# Patient Record
Sex: Female | Born: 1975 | Race: White | Hispanic: No | Marital: Married | Smoking: Never smoker
Health system: Southern US, Community
[De-identification: ages and names within clinical notes are randomized; demographics above are authoritative.]

## PROBLEM LIST (undated history)

## (undated) DIAGNOSIS — R51 Headache: Secondary | ICD-10-CM

## (undated) HISTORY — PX: NO PAST SURGERIES: SHX2092

---

## 2011-08-21 ENCOUNTER — Encounter (HOSPITAL_COMMUNITY): Payer: Self-pay

## 2011-08-24 ENCOUNTER — Other Ambulatory Visit: Payer: Self-pay | Admitting: Obstetrics

## 2011-08-30 ENCOUNTER — Encounter (HOSPITAL_COMMUNITY): Payer: Self-pay

## 2011-08-30 ENCOUNTER — Encounter (HOSPITAL_COMMUNITY)
Admission: RE | Admit: 2011-08-30 | Discharge: 2011-08-30 | Disposition: A | Discharge status: Home / Self Care | Payer: BC Managed Care – PPO | Source: Ambulatory Visit | Attending: Obstetrics | Admitting: Obstetrics

## 2011-08-30 HISTORY — DX: Headache: R51

## 2011-08-30 LAB — CBC
HCT: 39.2 % (ref 36.0–46.0)
Hemoglobin: 13.3 g/dL (ref 12.0–15.0)
MCV: 91 fL (ref 78.0–100.0)
RBC: 4.31 MIL/uL (ref 3.87–5.11)
RDW: 12.8 % (ref 11.5–15.5)
WBC: 4.8 10*3/uL (ref 4.0–10.5)

## 2011-08-30 LAB — SURGICAL PCR SCREEN: Staphylococcus aureus: NEGATIVE

## 2011-08-30 NOTE — Patient Instructions (Signed)
YOUR PROCEDURE IS SCHEDULED ON:09/07/11 ENTER THROUGH THE MAIN ENTRANCE OF [facility] AT:6am USE DESK PHONE AND DIAL [id number] TO INFORM us OF YOUR ARRIVAL  CALL [phone] IF YOU HAVE ANY QUESTIONS OR PROBLEMS PRIOR TO YOUR ARRIVAL.  REMEMBER: DO NOT EAT OR DRINK AFTER MIDNIGHT :09/07/11  SPECIAL INSTRUCTIONS:Hibiclens shower   YOU MAY BRUSH YOUR TEETH THE MORNING OF SURGERY   TAKE THESE MEDICINES THE DAY OF SURGERY WITH SIP OF WATER:none   DO NOT WEAR JEWELRY, EYE MAKEUP, LIPSTICK OR DARK FINGERNAIL POLISH DO NOT WEAR LOTIONS  DO NOT SHAVE FOR 48 HOURS PRIOR TO SURGERY  YOU WILL NOT BE ALLOWED TO DRIVE YOURSELF HOME.  NAME OF DRIVER:spouse- Felicia Craig

## 2011-09-06 MED ORDER — DOXYCYCLINE HYCLATE 100 MG IV SOLR
100.0000 mg | Freq: Two times a day (BID) | INTRAVENOUS | Status: DC
  Filled 2011-09-06 (×4): qty 100

## 2011-09-06 MED ORDER — DOXYCYCLINE HYCLATE 100 MG IV SOLR
100.0000 mg | Freq: Once | INTRAVENOUS | Status: AC
  Administered 2011-09-07: 100 mg via INTRAVENOUS
  Filled 2011-09-06: qty 100

## 2011-09-07 ENCOUNTER — Encounter (HOSPITAL_COMMUNITY)
Admission: RE | Disposition: A | Discharge status: Home / Self Care | Payer: Self-pay | Source: Ambulatory Visit | Attending: Obstetrics

## 2011-09-07 ENCOUNTER — Other Ambulatory Visit: Payer: Self-pay | Admitting: Obstetrics

## 2011-09-07 ENCOUNTER — Encounter (HOSPITAL_COMMUNITY): Payer: Self-pay | Admitting: *Deleted

## 2011-09-07 ENCOUNTER — Ambulatory Visit (HOSPITAL_COMMUNITY)
Admission: RE | Admit: 2011-09-07 | Discharge: 2011-09-07 | Disposition: A | Discharge status: Home / Self Care | Payer: BC Managed Care – PPO | Source: Ambulatory Visit | Attending: Obstetrics | Admitting: Obstetrics

## 2011-09-07 ENCOUNTER — Ambulatory Visit (HOSPITAL_COMMUNITY): Payer: BC Managed Care – PPO | Admitting: Anesthesiology

## 2011-09-07 ENCOUNTER — Encounter (HOSPITAL_COMMUNITY): Payer: Self-pay | Admitting: Anesthesiology

## 2011-09-07 DIAGNOSIS — N803 Endometriosis of pelvic peritoneum, unspecified: Secondary | ICD-10-CM | POA: Insufficient documentation

## 2011-09-07 DIAGNOSIS — N979 Female infertility, unspecified: Secondary | ICD-10-CM | POA: Insufficient documentation

## 2011-09-07 DIAGNOSIS — N805 Endometriosis of intestine, unspecified: Secondary | ICD-10-CM | POA: Insufficient documentation

## 2011-09-07 DIAGNOSIS — N8300 Follicular cyst of ovary, unspecified side: Secondary | ICD-10-CM | POA: Insufficient documentation

## 2011-09-07 DIAGNOSIS — Z01818 Encounter for other preprocedural examination: Secondary | ICD-10-CM | POA: Insufficient documentation

## 2011-09-07 DIAGNOSIS — Z01812 Encounter for preprocedural laboratory examination: Secondary | ICD-10-CM | POA: Insufficient documentation

## 2011-09-07 DIAGNOSIS — D252 Subserosal leiomyoma of uterus: Secondary | ICD-10-CM | POA: Insufficient documentation

## 2011-09-07 HISTORY — PX: LAPAROSCOPY: SHX197

## 2011-09-07 HISTORY — PX: HYSTEROSCOPY: SHX211

## 2011-09-07 LAB — PREGNANCY, URINE: Preg Test, Ur: NEGATIVE

## 2011-09-07 LAB — TYPE AND SCREEN
ABO/RH(D): A POS
Antibody Screen: NEGATIVE

## 2011-09-07 SURGERY — LAPAROSCOPY, DIAGNOSTIC
Anesthesia: General | Wound class: Clean Contaminated

## 2011-09-07 MED ORDER — NEOSTIGMINE METHYLSULFATE 1 MG/ML IJ SOLN
INTRAMUSCULAR | Status: DC | PRN
  Administered 2011-09-07: 2.5 mg via INTRAVENOUS

## 2011-09-07 MED ORDER — NEOSTIGMINE METHYLSULFATE 1 MG/ML IJ SOLN
INTRAMUSCULAR | Status: AC
  Filled 2011-09-07: qty 10

## 2011-09-07 MED ORDER — ONDANSETRON HCL 4 MG PO TABS: 4.0000 mg | ORAL_TABLET | Freq: Four times a day (QID) | ORAL | Status: DC | PRN

## 2011-09-07 MED ORDER — FENTANYL CITRATE 0.05 MG/ML IJ SOLN
25.0000 ug | INTRAMUSCULAR | Status: DC | PRN
  Administered 2011-09-07: 50 ug via INTRAVENOUS

## 2011-09-07 MED ORDER — KETOROLAC TROMETHAMINE 30 MG/ML IJ SOLN
INTRAMUSCULAR | Status: AC
  Filled 2011-09-07: qty 1

## 2011-09-07 MED ORDER — INDIGOTINDISULFONATE SODIUM 8 MG/ML IJ SOLN
INTRAMUSCULAR | Status: DC | PRN
  Administered 2011-09-07: 5 mL via INTRAVENOUS

## 2011-09-07 MED ORDER — HYDROMORPHONE HCL 2 MG PO TABS
2.0000 mg | ORAL_TABLET | Freq: Once | ORAL | Status: AC
  Administered 2011-09-07: 2 mg via ORAL

## 2011-09-07 MED ORDER — KETOROLAC TROMETHAMINE 30 MG/ML IJ SOLN: 30.0000 mg | Freq: Four times a day (QID) | INTRAMUSCULAR | Status: DC

## 2011-09-07 MED ORDER — ONDANSETRON HCL 4 MG/2ML IJ SOLN
INTRAMUSCULAR | Status: AC
  Filled 2011-09-07: qty 2

## 2011-09-07 MED ORDER — LIDOCAINE HCL (CARDIAC) 20 MG/ML IV SOLN
INTRAVENOUS | Status: AC
  Filled 2011-09-07: qty 5

## 2011-09-07 MED ORDER — LACTATED RINGERS IV SOLN
INTRAVENOUS | Status: DC
  Administered 2011-09-07 (×2): via INTRAVENOUS

## 2011-09-07 MED ORDER — HYDROMORPHONE HCL 2 MG PO TABS: 4.0000 mg | ORAL_TABLET | ORAL | Status: AC | PRN

## 2011-09-07 MED ORDER — GLYCOPYRROLATE 0.2 MG/ML IJ SOLN
INTRAMUSCULAR | Status: DC | PRN
  Administered 2011-09-07: .4 mg via INTRAVENOUS
  Administered 2011-09-07: 0.2 mg via INTRAVENOUS

## 2011-09-07 MED ORDER — PROPOFOL 10 MG/ML IV EMUL
INTRAVENOUS | Status: DC | PRN
  Administered 2011-09-07: 200 mg via INTRAVENOUS

## 2011-09-07 MED ORDER — FENTANYL CITRATE 0.05 MG/ML IJ SOLN
INTRAMUSCULAR | Status: AC
  Filled 2011-09-07: qty 5

## 2011-09-07 MED ORDER — ONDANSETRON HCL 4 MG/2ML IJ SOLN
INTRAMUSCULAR | Status: DC | PRN
  Administered 2011-09-07: 4 mg via INTRAVENOUS

## 2011-09-07 MED ORDER — SODIUM CHLORIDE 0.9 % IV SOLN: INTRAVENOUS | Status: DC

## 2011-09-07 MED ORDER — GLYCOPYRROLATE 0.2 MG/ML IJ SOLN
INTRAMUSCULAR | Status: AC
  Filled 2011-09-07: qty 1

## 2011-09-07 MED ORDER — LIDOCAINE HCL (CARDIAC) 20 MG/ML IV SOLN
INTRAVENOUS | Status: DC | PRN
  Administered 2011-09-07: 80 mg via INTRAVENOUS

## 2011-09-07 MED ORDER — ROCURONIUM BROMIDE 50 MG/5ML IV SOLN
INTRAVENOUS | Status: AC
  Filled 2011-09-07: qty 1

## 2011-09-07 MED ORDER — IBUPROFEN 600 MG PO TABS: 600.0000 mg | ORAL_TABLET | Freq: Four times a day (QID) | ORAL | Status: DC | PRN

## 2011-09-07 MED ORDER — BUPIVACAINE HCL (PF) 0.25 % IJ SOLN
INTRAMUSCULAR | Status: DC | PRN
  Administered 2011-09-07: 10 mL

## 2011-09-07 MED ORDER — MIDAZOLAM HCL 2 MG/2ML IJ SOLN
INTRAMUSCULAR | Status: AC
  Filled 2011-09-07: qty 2

## 2011-09-07 MED ORDER — ZOLPIDEM TARTRATE 5 MG PO TABS: 5.0000 mg | ORAL_TABLET | Freq: Every evening | ORAL | Status: DC | PRN

## 2011-09-07 MED ORDER — MENTHOL 3 MG MT LOZG: 1.0000 | LOZENGE | OROMUCOSAL | Status: DC | PRN

## 2011-09-07 MED ORDER — FENTANYL CITRATE 0.05 MG/ML IJ SOLN
INTRAMUSCULAR | Status: AC
  Administered 2011-09-07: 50 ug via INTRAVENOUS
  Filled 2011-09-07: qty 2

## 2011-09-07 MED ORDER — KETOROLAC TROMETHAMINE 30 MG/ML IJ SOLN
INTRAMUSCULAR | Status: DC | PRN
  Administered 2011-09-07: 30 mg via INTRAVENOUS

## 2011-09-07 MED ORDER — ROCURONIUM BROMIDE 100 MG/10ML IV SOLN
INTRAVENOUS | Status: DC | PRN
  Administered 2011-09-07: 50 mg via INTRAVENOUS
  Administered 2011-09-07 (×2): 10 mg via INTRAVENOUS

## 2011-09-07 MED ORDER — MIDAZOLAM HCL 5 MG/5ML IJ SOLN
INTRAMUSCULAR | Status: DC | PRN
  Administered 2011-09-07: 2 mg via INTRAVENOUS

## 2011-09-07 MED ORDER — KETOROLAC TROMETHAMINE 30 MG/ML IJ SOLN: 15.0000 mg | Freq: Once | INTRAMUSCULAR | Status: DC | PRN

## 2011-09-07 MED ORDER — SODIUM CHLORIDE 0.9 % IR SOLN
Status: DC | PRN
  Administered 2011-09-07: 3000 mL

## 2011-09-07 MED ORDER — HYDROMORPHONE HCL 2 MG PO TABS
ORAL_TABLET | ORAL | Status: AC
  Administered 2011-09-07: 2 mg via ORAL
  Filled 2011-09-07: qty 1

## 2011-09-07 MED ORDER — PROPOFOL 10 MG/ML IV EMUL
INTRAVENOUS | Status: AC
  Filled 2011-09-07: qty 20

## 2011-09-07 MED ORDER — FENTANYL CITRATE 0.05 MG/ML IJ SOLN
INTRAMUSCULAR | Status: DC | PRN
  Administered 2011-09-07: 150 ug via INTRAVENOUS
  Administered 2011-09-07: 100 ug via INTRAVENOUS

## 2011-09-07 MED ORDER — ONDANSETRON HCL 4 MG/2ML IJ SOLN: 4.0000 mg | Freq: Four times a day (QID) | INTRAMUSCULAR | Status: DC | PRN

## 2011-09-07 SURGICAL SUPPLY — 88 items
BAG URINE DRAINAGE (UROLOGICAL SUPPLIES) ×2 IMPLANT
BARRIER ADHS 3X4 INTERCEED (GAUZE/BANDAGES/DRESSINGS) IMPLANT
BLADE LAPAROSCOPIC MORCELL KIT (BLADE) IMPLANT
BLADE SURG 15 STRL LF C SS BP (BLADE) ×1 IMPLANT
BLADE SURG 15 STRL SS (BLADE) ×1
BLADELESS LONG 8MM (BLADE) IMPLANT
CABLE HIGH FREQUENCY MONO STRZ (ELECTRODE) ×2 IMPLANT
CANISTER SUCTION 2500CC (MISCELLANEOUS) ×2 IMPLANT
CATH FOLEY 3WAY  5CC 16FR (CATHETERS) ×1
CATH FOLEY 3WAY 5CC 16FR (CATHETERS) ×1 IMPLANT
CATH ROBINSON RED A/P 16FR (CATHETERS) IMPLANT
CHLORAPREP W/TINT 26ML (MISCELLANEOUS) ×2 IMPLANT
CLOTH BEACON ORANGE TIMEOUT ST (SAFETY) ×2 IMPLANT
CONT PATH 16OZ SNAP LID 3702 (MISCELLANEOUS) ×2 IMPLANT
CONTAINER PREFILL 10% NBF 60ML (FORM) ×4 IMPLANT
COVER MAYO STAND STRL (DRAPES) ×2 IMPLANT
COVER TABLE BACK 60X90 (DRAPES) ×4 IMPLANT
COVER TIP SHEARS 8 DVNC (MISCELLANEOUS) ×1 IMPLANT
COVER TIP SHEARS 8MM DA VINCI (MISCELLANEOUS) ×1
DECANTER SPIKE VIAL GLASS SM (MISCELLANEOUS) ×2 IMPLANT
DERMABOND ADVANCED (GAUZE/BANDAGES/DRESSINGS) ×2
DERMABOND ADVANCED .7 DNX12 (GAUZE/BANDAGES/DRESSINGS) ×2 IMPLANT
DRAPE HUG U DISPOSABLE (DRAPE) ×2 IMPLANT
DRAPE LG THREE QUARTER DISP (DRAPES) ×4 IMPLANT
DRAPE MONITOR DA VINCI (DRAPE) ×2 IMPLANT
DRAPE WARM FLUID 44X44 (DRAPE) ×2 IMPLANT
ELECT NEEDLE TIP 2.8 STRL (NEEDLE) ×2 IMPLANT
ELECT REM PT RETURN 9FT ADLT (ELECTROSURGICAL) ×2
ELECTRODE REM PT RTRN 9FT ADLT (ELECTROSURGICAL) ×1 IMPLANT
EVACUATOR SMOKE 8.L (FILTER) ×2 IMPLANT
FORCEPS CUTTING 33CM 5MM (CUTTING FORCEPS) IMPLANT
GAUZE VASELINE 3X9 (GAUZE/BANDAGES/DRESSINGS) IMPLANT
GLOVE BIO SURGEON STRL SZ 6.5 (GLOVE) ×2 IMPLANT
GLOVE BIOGEL PI IND STRL 7.0 (GLOVE) ×2 IMPLANT
GLOVE BIOGEL PI INDICATOR 7.0 (GLOVE) ×2
GOWN PREVENTION PLUS LG XLONG (DISPOSABLE) ×8 IMPLANT
GOWN STRL REIN XL XLG (GOWN DISPOSABLE) ×2 IMPLANT
IV STOPCOCK 4 WAY 40  W/Y SET (IV SOLUTION) ×1
IV STOPCOCK 4 WAY 40 W/Y SET (IV SOLUTION) ×1 IMPLANT
KIT ACCESSORY DA VINCI DISP (KITS) ×1
KIT ACCESSORY DVNC DISP (KITS) ×1 IMPLANT
KIT DISP ACCESSORY 4 ARM (KITS) IMPLANT
LOOP ANGLED CUTTING 22FR (CUTTING LOOP) IMPLANT
MANIPULATOR UTERINE 4.5 ZUMI (MISCELLANEOUS) IMPLANT
NEEDLE HYPO 22GX1.5 SAFETY (NEEDLE) IMPLANT
NEEDLE INSUFFLATION 14GA 120MM (NEEDLE) IMPLANT
OCCLUDER COLPOPNEUMO (BALLOONS) IMPLANT
PACK HYSTEROSCOPY LF (CUSTOM PROCEDURE TRAY) IMPLANT
PACK LAPAROSCOPY BASIN (CUSTOM PROCEDURE TRAY) IMPLANT
PACK LAVH (CUSTOM PROCEDURE TRAY) ×2 IMPLANT
PAD PREP 24X48 CUFFED NSTRL (MISCELLANEOUS) ×4 IMPLANT
PENCIL BUTTON HOLSTER BLD 10FT (ELECTRODE) ×2 IMPLANT
PLUG CATH AND CAP STER (CATHETERS) ×2 IMPLANT
POSITIONER SURGICAL ARM (MISCELLANEOUS) ×4 IMPLANT
POUCH SPECIMEN RETRIEVAL 10MM (ENDOMECHANICALS) IMPLANT
SET IRRIG TUBING LAPAROSCOPIC (IRRIGATION / IRRIGATOR) ×4 IMPLANT
SOLUTION ELECTROLUBE (MISCELLANEOUS) ×2 IMPLANT
SPONGE LAP 18X18 X RAY DECT (DISPOSABLE) IMPLANT
SUT VIC AB 0 CT1 27 (SUTURE) ×2
SUT VIC AB 0 CT1 27XBRD ANBCTR (SUTURE) ×2 IMPLANT
SUT VIC AB 0 CT1 27XBRD ANTBC (SUTURE) IMPLANT
SUT VIC AB 0 CT2 27 (SUTURE) IMPLANT
SUT VIC AB 2-0 CT1 27 (SUTURE)
SUT VIC AB 2-0 CT1 TAPERPNT 27 (SUTURE) IMPLANT
SUT VIC AB 4-0 PS2 27 (SUTURE) ×4 IMPLANT
SUT VICRYL 0 27 CT2 27 ABS (SUTURE) IMPLANT
SUT VICRYL 0 UR6 27IN ABS (SUTURE) ×4 IMPLANT
SUT VICRYL 4-0 PS2 18IN ABS (SUTURE) IMPLANT
SYR 50ML LL SCALE MARK (SYRINGE) ×2 IMPLANT
SYR 5ML LL (SYRINGE) ×2 IMPLANT
SYSTEM CONVERTIBLE TROCAR (TROCAR) IMPLANT
TIP UTERINE 5.1X6CM LAV DISP (MISCELLANEOUS) IMPLANT
TIP UTERINE 6.7X10CM GRN DISP (MISCELLANEOUS) IMPLANT
TIP UTERINE 6.7X6CM WHT DISP (MISCELLANEOUS) IMPLANT
TIP UTERINE 6.7X8CM BLUE DISP (MISCELLANEOUS) IMPLANT
TOWEL OR 17X24 6PK STRL BLUE (TOWEL DISPOSABLE) ×6 IMPLANT
TRAY FOLEY CATH 14FR (SET/KITS/TRAYS/PACK) IMPLANT
TROCAR 12M 150ML BLUNT (TROCAR) IMPLANT
TROCAR BALLN 12MMX100 BLUNT (TROCAR) IMPLANT
TROCAR DISP BLADELESS 8 DVNC (TROCAR) ×1 IMPLANT
TROCAR DISP BLADELESS 8MM (TROCAR) ×1
TROCAR XCEL 12X100 BLDLESS (ENDOMECHANICALS) ×2 IMPLANT
TROCAR XCEL NON-BLD 11X100MML (ENDOMECHANICALS) IMPLANT
TROCAR XCEL NON-BLD 5MMX100MML (ENDOMECHANICALS) ×4 IMPLANT
TROCAR Z-THREAD BLADED 12X100M (TROCAR) ×2 IMPLANT
TUBING FILTER THERMOFLATOR (ELECTROSURGICAL) ×4 IMPLANT
WARMER LAPAROSCOPE (MISCELLANEOUS) ×2 IMPLANT
WATER STERILE IRR 1000ML POUR (IV SOLUTION) ×6 IMPLANT

## 2011-09-07 NOTE — Anesthesia Preprocedure Evaluation (Signed)
Anesthesia Evaluation  Patient identified by MRN, date of birth, ID band Patient awake    Reviewed: Allergy & Precautions, H&P , Patient's Chart, lab work & pertinent test results, reviewed documented beta blocker date and time   Airway Mallampati: II TM Distance: >3 FB Neck ROM: full    Dental No notable dental hx.    Pulmonary  clear to auscultation  Pulmonary exam normal       Cardiovascular regular Normal    Neuro/Psych    GI/Hepatic   Endo/Other    Renal/GU      Musculoskeletal   Abdominal   Peds  Hematology   Anesthesia Other Findings   Reproductive/Obstetrics                           Anesthesia Physical Anesthesia Plan  ASA: II  Anesthesia Plan: General   Post-op Pain Management:    Induction: Intravenous  Airway Management Planned: Oral ETT  Additional Equipment:   Intra-op Plan:   Post-operative Plan:   Informed Consent: I have reviewed the patients History and Physical, chart, labs and discussed the procedure including the risks, benefits and alternatives for the proposed anesthesia with the patient or authorized representative who has indicated his/her understanding and acceptance.   Dental Advisory Given and Dental advisory given  Plan Discussed with: CRNA and Surgeon  Anesthesia Plan Comments: (  Discussed  general anesthesia, including possible nausea, instrumentation of airway, sore throat,pulmonary aspiration, etc. I asked if the were any outstanding questions, or  concerns before we proceeded. )        Anesthesia Quick Evaluation  

## 2011-09-07 NOTE — Op Note (Addendum)
PATIENT:  Felicia Craig  36 y.o. female  PRE-OPERATIVE DIAGNOSIS:  Unexplained Infertility  POST-OPERATIVE DIAGNOSIS:  Unexplained Infertility, moderate endometriosis involving colon  PROCEDURE:  Procedure(s): LAPAROSCOPY DIAGNOSTIC ROBOTIC ASSISTED LAPAROSCOPIC LYSIS OF ADHESION HYSTEROSCOPY, chromopertubation, fulguration of endometriosis, peritoneal and ovarian biopsy  Findings: 5 cm fundal fibroid, 2 cm inflamed posterior subserosal fibroid with broad based attachment to uterus and adhesions to rectum. Filmy adhesions between anterior uterus and bladder. Simple L ovarian follicular cyst with rupture intraop, normal R ovary, multiple endometriotic implants in post cul-de-sac and along uterosacral ligament, endometrial implants on subserosal fibroid, L round ligament, and rectum, distortion of rectum, pulled midline from adhesive disease. Normal R tube w/ clear spill of indigo carmen dye, nl bilateral fimbriae, L tube with no spill of dye, despite compression on R tube, small adhesion of L tube to pelvic side wall which was released but still failed to achieve spill of dye. Nl liver edge and gallbladder- no Fitz-Hugh adhesions. On hysteroscopy: arcuate uterine variation, nl b/l ostia, evidence of uterine manipulator sitting in R side of arcuate  Uterus leading to the conclusion that the lack of spill from the left tube was likley from placement of the uterine manipulator high on the R side of the arcuate.  SURGEON:  Surgeon(s): [employer] [facility] [employer]. Ernestina Penna, MD Robley Fries  PHYSICIAN ASSISTANT: none  ASSISTANTS: Mody   ANESTHESIA:   general  EBL:  Total I/O In: 1200 [I.V.:1200] Out: 300 [Urine:300]  BLOOD ADMINISTERED:none  DRAINS: none   LOCAL MEDICATIONS USED:  MARCAINE 15 CC  SPECIMEN:  Biopsy / Limited Resection: posterior peritoneal biopsy, L ovarian biopsy  DISPOSITION OF SPECIMEN:  PATHOLOGY   Indications: This is a 36 yo G2P0010 w/ unexplained secondary infertility. Patient  is status post 7 cycles of ovulation induction, many with intrauterine insemination and no successful pregnancy. Prior to consideration of further reproductive assistance the patient opted for diagnostic laparoscopy to ensure tubal patency and normal anatomic relationships. Patient did have a prior HSG showing tubal patency no known history of endometriosis, no known history of pelvic inflammatory disease, no prior surgeries.  Procedure: After informed consent including discussion of risks of bleeding, infection, failure to achieve successful pregnancy postoperatively and discussion of alternatives including in IVF, the patient was taken to the operating room where general anesthesia was initiated without difficulty. She was prepped and draped in normal sterile fashion in the dorsal supine lithotomy position. A Foley catheter was inserted sterilely into the bladder. A bimanual examination was done to assess the size and position of the uterus. The speculum was inserted into the vagina. A single-tooth tenaculum was used to grasp the anterior lip of the cervix. The cervix was dilated to a #21 Pratt  dilator. A ZUMI uterine manipulator was easily inserted into the uterus and the intrauterine balloon was inflated.  Attention was then turned to the patient's abdomen. 1/2 % marcaine was used prior to incision. A total of 15 cc of marcaine was used.  A 10 mm incision was made in the umbilicus and blunt and sharp dissection was done until the fascia was identified. This was then grasped with cover clamps x2 and entered sharply. A pursestring suture of 0 Vicryl was then placed along the incision and a non-bladed Roseanne Reno was inserted into the peritoneal cavity. Intraperitoneal placement was confirmed with the use of the camera and pneumoperitoneum was created to 15 mm of mercury. The pursestring suture was secured around the port and pneumoperitoneum was maintained. Brief survey of the  abdomen and pelvis was done with  findings as above.   The LLQ port was placed first and by putting a blunt probe through this port and placing the patient in Trendelenburg position further evaluation of the pelvis was done. Chromopertubation was carried out with clear spill from the right tube. No spill was seen from the left tube.  There was some difficulty in manipulating the uterus secondary to adhesions in the posterior cul-de-sac. At this point the decision was made to proceed with a robotic-assisted lysis of adhesions to help further evaluate the posterior cul-de-sac and the adhesions between the fibroid and rectum. The abdominal wall was assessed and additional port sites were marked. 8 mm incisions were placed in the right and left lower quadrants and a 5mm incision was was in the  left upper quadrant after assuring an NG tube was in place. Ports were placed under direct visualization.   I then went to the robotic console. I started by closely evaluating the right tube and ovary good anatomic relationship was noted. No evidence of endometriosis was noted in this location. The fimbria under fluid appear to float well and work properly. the ovarian fossa was clean of any adhesions. No endometriotic implants were noted. The right pelvic sidewall had several endometriotic implants which were fulgurated there was also some windows of the peritoneal scar which was carefully dissected with monopolar scissors both with and without cautery. The posterior cul-de-sac had a 2 cm subserosal fibroid with significant adhesions to both the peritoneal tissue and the rectum. Careful dissection was done just along the border of the fibroid to clear the peritoneum from the fibroid and the subsequently released some of the rectal adhesions. Additional fulguration of endometriosis was done in the posterior cul-de-sac. A biopsy of one of the endometrial implants was done. The left tube and ovary were then carefully evaluated again no dye was seen coming from  the left tube on chromopertubation the proximal left tube appeared normal the distal left tube was slightly larger than the proximal end however no external compression was noted no clear hydrosalpinx was noted. The fimbria appeared normal both on first inspection and when floated in saline. There is a small endometriotic implants in the mesosalpinx that this was more towards the round ligament. This was fulgurated. The left ovarian fossa was cleared and endometriotic implants. A very small filmy adhesion was released between the mesial salpinx at the level of the fimbria and the peritoneal wall there was no bleeding at the site. During manipulation of the tube and ovary the left ovarian follicular cyst was inadvertently ruptured. Bleeding was noted from this and a combination of monopolar and bipolar cautery in addition to suction irrigation was used to control hemostasis of this cyst. The endometriotic implants along the posterior subserosal fibroid were then cauterized with bipolar cautery. Good hemostasis was noted. Good anatomic relationships between the bilateral tubes and ovaries were noted and the decision was made to complete the laparoscopic portion of the case.  The robotic portion was completed. The robot was undocked. By standard laparoscopy the patient was placed in reverse Trendelenburg, all additional fluid was suctioned from the abdomen and pelvis. The ovarian cyst was  found to be hemostatic. Under direct visualization the ports were removed. Pneumoperitoneum was released and the umbilical port was removed. The fascial edges of the umbilical incision were closed with the pursestring suture that was placed at the start of the case. A 4-0 Vicryl was used to close the additional  laparoscopic ports sites. Dermabond was placed. Good hemostasis was noted.  The hysteroscopic portion of the case was then begun. A speculum was placed in the vagina after the uterine multiplanar was removed. A  single-tooth tenaculum was used to grasp the anterior lip of the cervix and the small hysteroscope was inserted into the uterus. Suction irrigation was carried out. The right ostia was visualized and appeared normal. Fluffy endometrium was noted consistent with a day of cycle and prior trauma from earlier manipulation in the case. And arcuate uterine variation was noted. The left side of the arcuate uterus was much easier to visualize the ostia as there was clearly no trauma from earlier in the case fluffy endometrium was noted but this was smooth. The left ostia appeared normal. No polyps were noted no intracavitary fibroids or significant endometrial compression from fibroid tumors noted. The hysteroscope was then removed. Tenaculum was removed. The tenaculum site was hemostatic and the case was terminated.  Sponge lap and needle counts were correct x3 the patient was woken from general anesthesia having tolerated the procedure well and taken to the recovery room in a stable fashion.  Alixandria Friedt A. 05/30/2011 4:14 PM   Addend to pre-operative diagnosis: pelvic pain, possible endometriosis, infertility Post-op diagnosis: endometriosis

## 2011-09-07 NOTE — Anesthesia Procedure Notes (Signed)
Procedures

## 2011-09-07 NOTE — Anesthesia Postprocedure Evaluation (Signed)
Anesthesia Post Note  Patient: Felicia Craig  Procedure(s) Performed:  LAPAROSCOPY DIAGNOSTIC - Diagnostic Laparoscopy with Chromopertubation; ROBOTIC ASSISTED LAPAROSCOPIC LYSIS OF ADHESION; HYSTEROSCOPY  Anesthesia type: GA  Patient location: PACU  Post pain: Pain level controlled  Post assessment: Post-op Vital signs reviewed  Last Vitals:  Filed Vitals:   09/07/11 1015  BP:   Pulse: 65  Temp:   Resp: 16    Post vital signs: Reviewed  Level of consciousness: sedated  Complications: No apparent anesthesia complications

## 2011-09-07 NOTE — H&P (Signed)
H&P update  See full H&P under scanned docs/ media tab  In short, unexplained fertility, s/p 7 stimulatory cycles w/ good ovulation, 3 w/ IUI. Planning diagnostic l'scope for unexplained infertility, possible LOA. Also w/ question of polyps on HSG and for hysteroscopic eval.  All: Codeine PSH: none PMH: hypothyroidism  PE:  Filed Vitals:   09/07/11 0612  BP: 125/84  Pulse: 76  Temp: 99 F (37.2 C)  TempSrc: Oral  Resp: 18  SpO2: 100%    Gen: well appearing Abd: soft, NT GU: def to OR  CBC    Component Value Date/Time   WBC 4.8 08/30/2011 1103   RBC 4.31 08/30/2011 1103   HGB 13.3 08/30/2011 1103   HCT 39.2 08/30/2011 1103   PLT 220 08/30/2011 1103   MCV 91.0 08/30/2011 1103   MCH 30.9 08/30/2011 1103   MCHC 33.9 08/30/2011 1103   RDW 12.8 08/30/2011 1103      A/P: diagnostic l'scope and hysteroscope w/ possible LOA, polypectomy and chromopertubation  Felicia Craig A. 09/07/2011 7:32 AM

## 2011-09-07 NOTE — Transfer of Care (Signed)
Immediate Anesthesia Transfer of Care Note  Patient: Felicia Craig  Procedure(s) Performed:  LAPAROSCOPY DIAGNOSTIC - Diagnostic Laparoscopy with Chromopertubation; ROBOTIC ASSISTED LAPAROSCOPIC LYSIS OF ADHESION; HYSTEROSCOPY  Patient Location: PACU  Anesthesia Type: General  Level of Consciousness: awake, alert , oriented, patient cooperative and responds to stimulation  Airway & Oxygen Therapy: Patient Spontanous Breathing and Patient connected to nasal cannula oxygen  Post-op Assessment: Report given to PACU RN  Post vital signs: stable  Complications: No apparent anesthesia complications

## 2011-09-07 NOTE — Brief Op Note (Signed)
09/07/2011  10:11 AM  PATIENT:  Felicia Craig  36 y.o. female  PRE-OPERATIVE DIAGNOSIS:  Unexplained Infertility  POST-OPERATIVE DIAGNOSIS:  Unexplained Infertility, moderate endometriosis involving colon  PROCEDURE:  Procedure(s): LAPAROSCOPY DIAGNOSTIC ROBOTIC ASSISTED LAPAROSCOPIC LYSIS OF ADHESION HYSTEROSCOPY, chromopertubation  SURGEON:  Surgeon(s): Nadia Viar A. Ernestina Penna, MD Robley Fries  PHYSICIAN ASSISTANT: none  ASSISTANTS: Mody   ANESTHESIA:   general  EBL:  Total I/O In: 1200 [I.V.:1200] Out: 300 [Urine:300]  BLOOD ADMINISTERED:none  DRAINS: none   LOCAL MEDICATIONS USED:  MARCAINE 15 CC  SPECIMEN:  Biopsy / Limited Resection: posterior peritoneal biopsy, L ovarian biopsy  DISPOSITION OF SPECIMEN:  PATHOLOGY  COUNTS:  YES  TOURNIQUET:  * No tourniquets in log *  DICTATION: .Note written in EPIC  PLAN OF CARE: Discharge to home after PACU  PATIENT DISPOSITION:  PACU - hemodynamically stable.   Delay start of Pharmacological VTE agent (>24hrs) due to surgical blood loss or risk of bleeding:  {YES/NO/NOT APPLICABLE:20182

## 2011-09-11 ENCOUNTER — Encounter (HOSPITAL_COMMUNITY): Payer: Self-pay | Admitting: Obstetrics

## 2017-03-09 ENCOUNTER — Other Ambulatory Visit: Payer: Self-pay | Admitting: Obstetrics

## 2017-03-09 DIAGNOSIS — R928 Other abnormal and inconclusive findings on diagnostic imaging of breast: Secondary | ICD-10-CM

## 2017-03-14 ENCOUNTER — Ambulatory Visit
Admission: RE | Admit: 2017-03-14 | Discharge: 2017-03-14 | Disposition: A | Discharge status: Home / Self Care | Payer: BLUE CROSS/BLUE SHIELD | Source: Ambulatory Visit | Attending: Obstetrics | Admitting: Obstetrics

## 2017-03-14 ENCOUNTER — Other Ambulatory Visit: Payer: Self-pay | Admitting: Obstetrics

## 2017-03-14 DIAGNOSIS — R928 Other abnormal and inconclusive findings on diagnostic imaging of breast: Secondary | ICD-10-CM

## 2017-03-14 DIAGNOSIS — N6320 Unspecified lump in the left breast, unspecified quadrant: Secondary | ICD-10-CM

## 2017-03-20 ENCOUNTER — Other Ambulatory Visit: Payer: Self-pay | Admitting: Obstetrics

## 2017-03-20 DIAGNOSIS — N63 Unspecified lump in unspecified breast: Secondary | ICD-10-CM

## 2017-09-17 ENCOUNTER — Other Ambulatory Visit: Payer: Self-pay | Admitting: Obstetrics

## 2017-09-17 ENCOUNTER — Ambulatory Visit
Admission: RE | Admit: 2017-09-17 | Discharge: 2017-09-17 | Disposition: A | Discharge status: Home / Self Care | Payer: BLUE CROSS/BLUE SHIELD | Source: Ambulatory Visit | Attending: Obstetrics | Admitting: Obstetrics

## 2017-09-17 DIAGNOSIS — N6320 Unspecified lump in the left breast, unspecified quadrant: Secondary | ICD-10-CM

## 2017-09-17 DIAGNOSIS — N63 Unspecified lump in unspecified breast: Secondary | ICD-10-CM

## 2018-03-18 ENCOUNTER — Other Ambulatory Visit: Payer: BLUE CROSS/BLUE SHIELD

## 2019-11-26 IMAGING — MG 2D DIGITAL DIAGNOSTIC UNILATERAL LEFT MAMMOGRAM WITH CAD AND ADJ
6 series · 6 of 14 positions shown · non-contrast
Comparison: Previous exam(s).

CLINICAL DATA: 41-year-old female presenting for first six-month
follow-up of a probably benign left breast cyst cluster.

EXAM:
2D DIGITAL DIAGNOSTIC LEFT MAMMOGRAM WITH CAD AND ADJUNCT TOMO
ULTRASOUND LEFT BREAST

[L MLO]
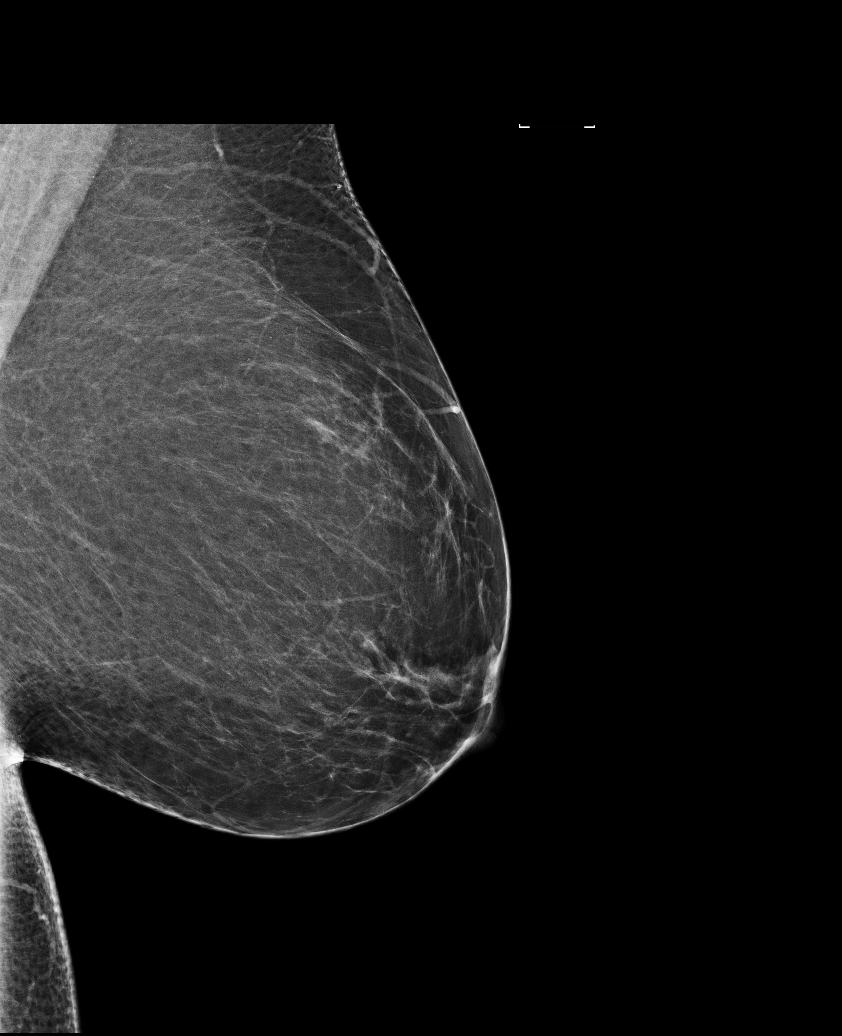

[L MLO synth-2D]
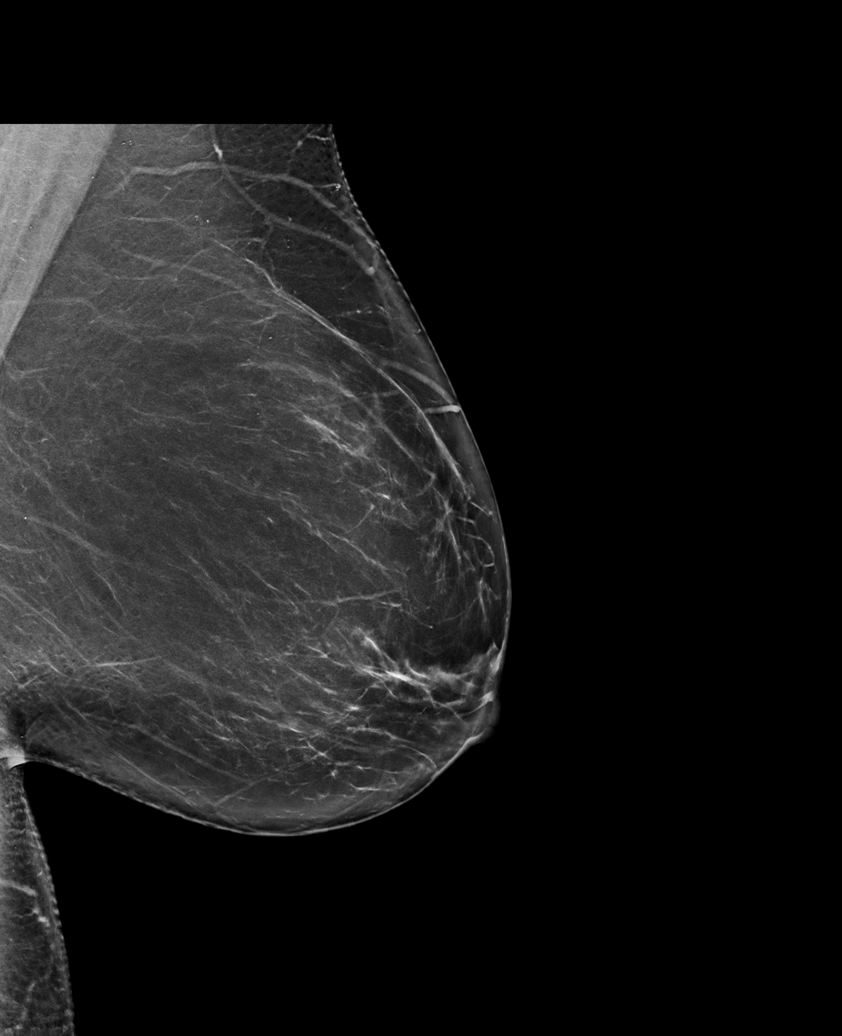

[L CC synth-2D]
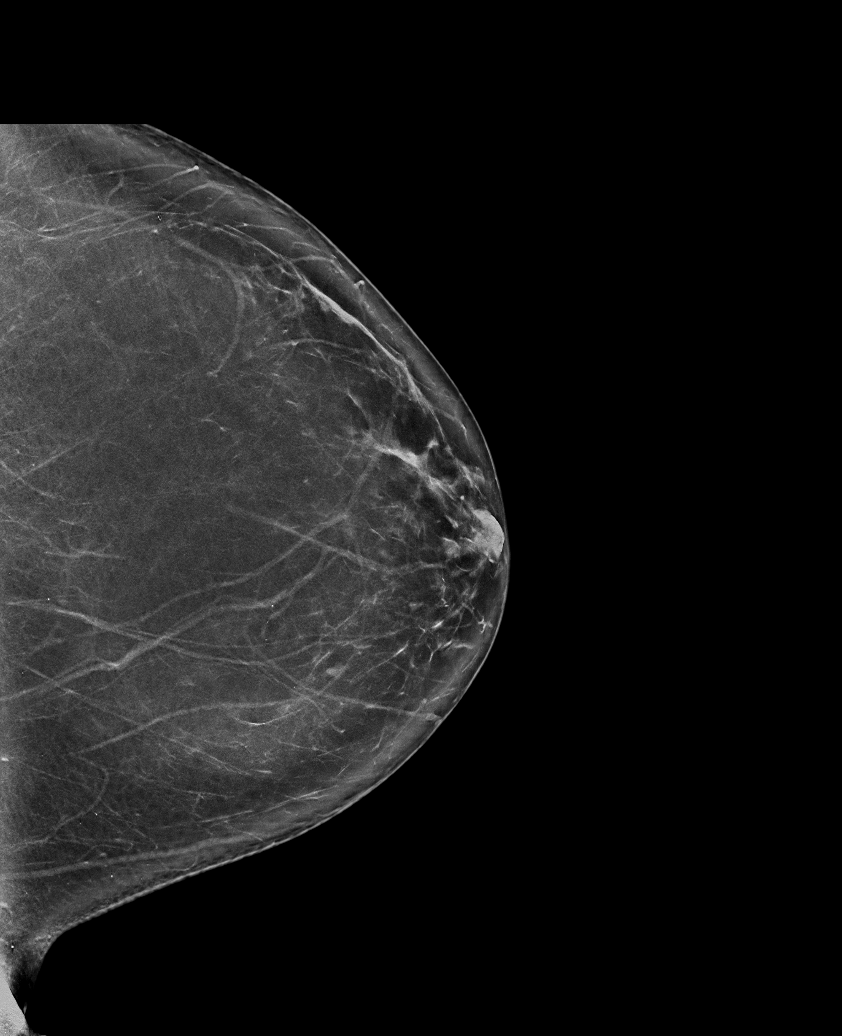

[L CC]
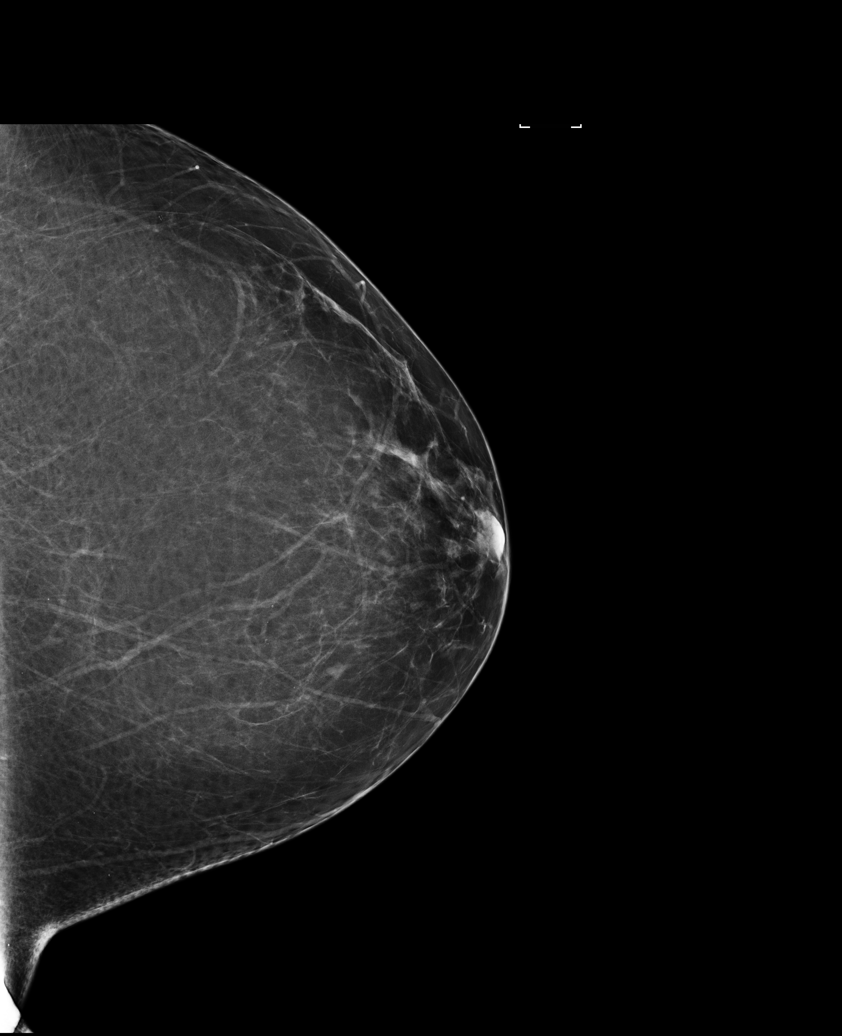

[L CC tomo · tomo slice 42/83.0]
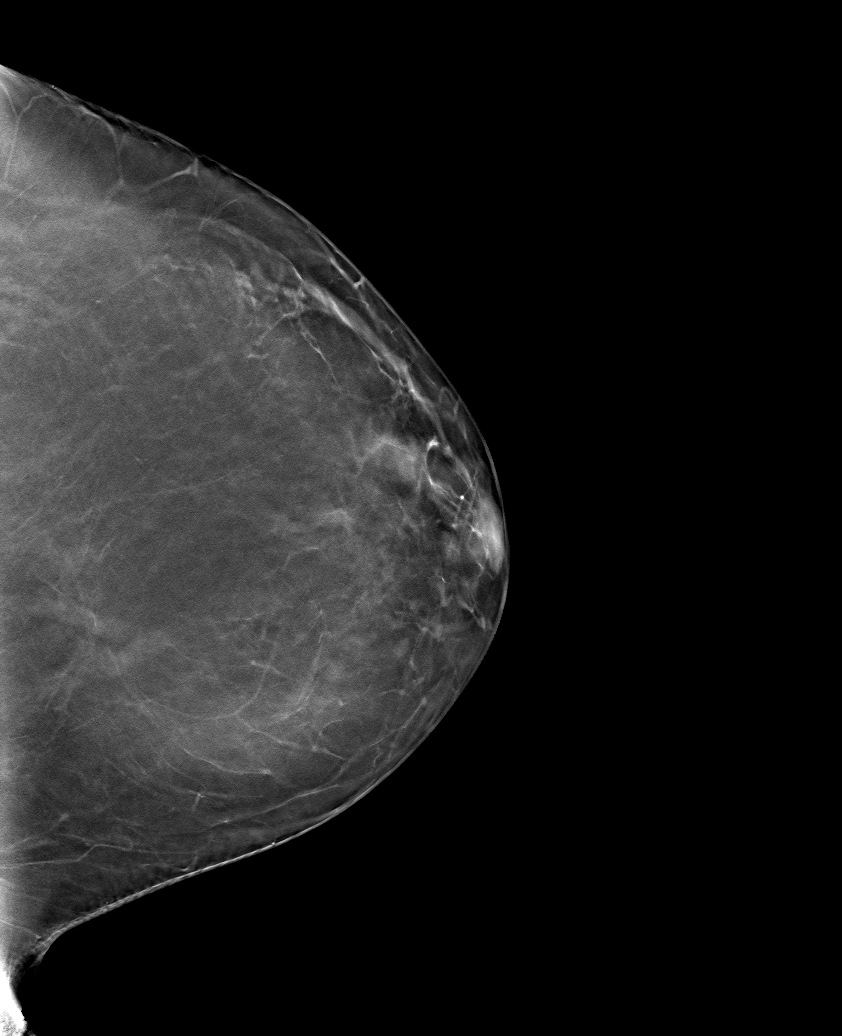

[L MLO tomo · tomo slice 46/91.0]
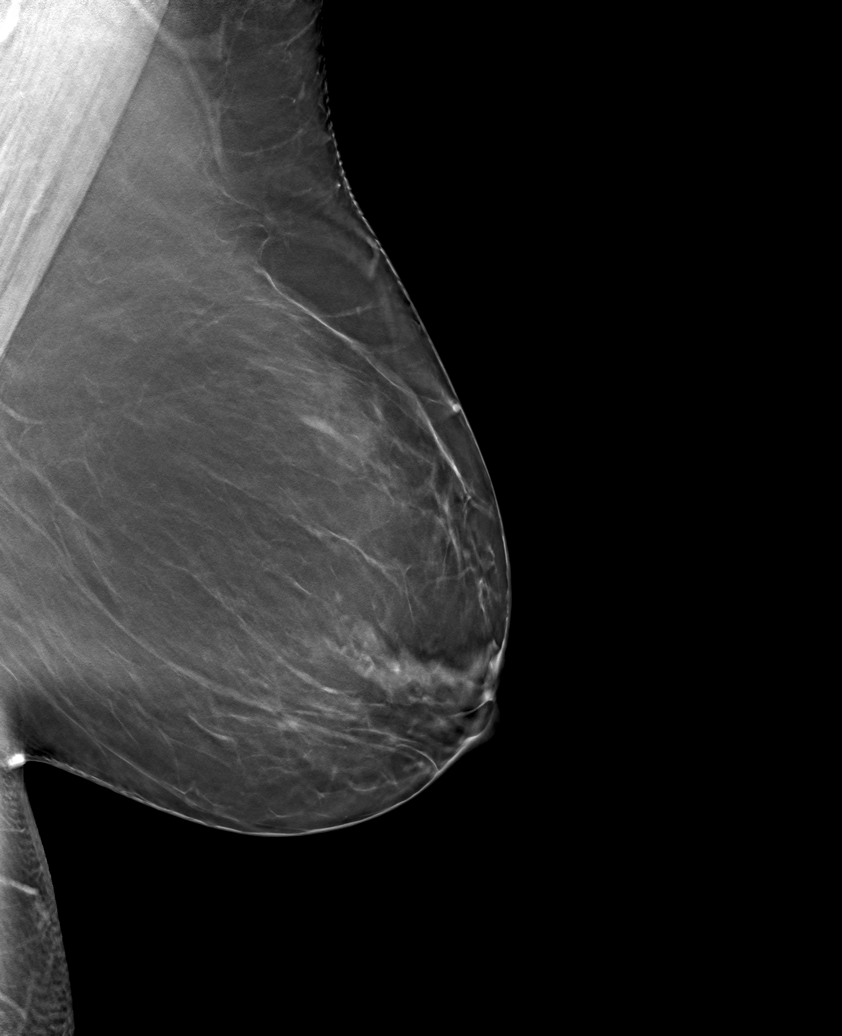

[6 of 14 positions shown; findings below may reference images not displayed]

ACR Breast Density Category b: There are scattered areas of
fibroglandular density.
FINDINGS: An irregular mass again is identified in the lower inner quadrant of
the left breast at anterior to middle depth. The appearance is
mammographically stable. No other suspicious findings are identified
in the left breast.

Mammographic images were processed with CAD.

Targeted ultrasound is performed, showing a probable cyst cluster at
the 7 o'clock position 3 cm from the nipple. It measures 0.5 x 0.4 x
0.2 cm. There has been no significant interval change.
IMPRESSION: Stable, probably benign probable left breast cyst cluster.
Recommendation is for continued six-month follow-up to coincide with
the patient's annual bilateral mammogram.

RECOMMENDATION:
Diagnostic bilateral mammogram and left breast ultrasound in 6
months.

I have discussed the findings and recommendations with the patient.
Results were also provided in writing at the conclusion of the
visit. If applicable, a reminder letter will be sent to the patient
regarding the next appointment.

BI-RADS CATEGORY  3: Probably benign.

## 2020-07-12 ENCOUNTER — Encounter (HOSPITAL_BASED_OUTPATIENT_CLINIC_OR_DEPARTMENT_OTHER): Payer: Self-pay

## 2020-07-12 ENCOUNTER — Emergency Department (HOSPITAL_BASED_OUTPATIENT_CLINIC_OR_DEPARTMENT_OTHER)
Admission: EM | Admit: 2020-07-12 | Discharge: 2020-07-12 | Disposition: A | Discharge status: Home / Self Care | Payer: BC Managed Care – PPO | Attending: Emergency Medicine | Admitting: Emergency Medicine

## 2020-07-12 ENCOUNTER — Emergency Department (HOSPITAL_BASED_OUTPATIENT_CLINIC_OR_DEPARTMENT_OTHER): Payer: BC Managed Care – PPO

## 2020-07-12 ENCOUNTER — Other Ambulatory Visit: Payer: Self-pay

## 2020-07-12 DIAGNOSIS — R079 Chest pain, unspecified: Secondary | ICD-10-CM | POA: Insufficient documentation

## 2020-07-12 LAB — BASIC METABOLIC PANEL WITH GFR
Anion gap: 11 (ref 5–15)
BUN: 18 mg/dL (ref 6–20)
CO2: 19 mmol/L — ABNORMAL LOW (ref 22–32)
Calcium: 9.1 mg/dL (ref 8.9–10.3)
Chloride: 105 mmol/L (ref 98–111)
Creatinine, Ser: 1.03 mg/dL — ABNORMAL HIGH (ref 0.44–1.00)
GFR, Estimated: >60 mL/min
Glucose, Bld: 123 mg/dL — ABNORMAL HIGH (ref 70–99)
Potassium: 3.7 mmol/L (ref 3.5–5.1)
Sodium: 135 mmol/L (ref 135–145)

## 2020-07-12 LAB — CBC
HCT: 42.5 % (ref 36.0–46.0)
Hemoglobin: 13.3 g/dL (ref 12.0–15.0)
MCH: 27.4 pg (ref 26.0–34.0)
MCHC: 31.3 g/dL (ref 30.0–36.0)
MCV: 87.4 fL (ref 80.0–100.0)
Platelets: 298 10*3/uL (ref 150–400)
RBC: 4.86 MIL/uL (ref 3.87–5.11)
RDW: 14.3 % (ref 11.5–15.5)
WBC: 7.3 10*3/uL (ref 4.0–10.5)
nRBC: 0 % (ref 0.0–0.2)

## 2020-07-12 LAB — TROPONIN I (HIGH SENSITIVITY): Troponin I (High Sensitivity): 2 ng/L

## 2020-07-12 LAB — PREGNANCY, URINE: Preg Test, Ur: NEGATIVE

## 2020-07-12 NOTE — ED Notes (Signed)
Cont to await for ED Provider evaluation

## 2020-07-12 NOTE — ED Notes (Signed)
ED Provider at bedside. 

## 2020-07-12 NOTE — ED Triage Notes (Addendum)
Pt c/o CP day 3-denies fever/flu sx-NAD-steady gait

## 2020-07-12 NOTE — ED Provider Notes (Signed)
Felicia Craig   CSN: [id number] Arrival date & time: 07/12/20  1414     History Chief Complaint  Patient presents with  . Chest Pain    Felicia Craig is a 44 y.o. female with a history of obesity, chronic pain, reflux, recent emergency department chest pain.  She reports she has had intermittent, sharp and pleuritic pain across her chest over the weekend.  It began as left-sided chest pain that was pleuritic and lasting several hours.  That went away and then she began having right-sided chest pain that was pleuritic.  Today she woke up and had a migraine, which she says she suffers from chronically, and also had fleeting substernal sharp chest pain.  This is since gone away and she is currently asymptomatic.  She says she has never had this kind of symptoms before.  She denies any fevers, chills, coughing.  She reports he did receive both Covid vaccines.  She denies any personal history of angina or coronary disease.  She does report a family history of MI in a cousin in their 65s or 78s, but no other significant family history.  She does not smoke.  No hemoptysis or asymmetric LE edema. Patient denies personal or family history of DVT or PE. No recent hormone use (including OCP); travel for >6 hours; prolonged immobilization for greater than 3 days; surgeries or trauma in the last 4 weeks; or malignancy with treatment within 6 months.   HPI     Past Medical History:  Diagnosis Date  . Headache(784.0)     There are no problems to display for this patient.   Past Surgical History:  Procedure Laterality Date  . HYSTEROSCOPY  09/07/2011   Procedure: HYSTEROSCOPY;  Surgeon: Claiborne Billings A. Pamala Hurry, MD;  Location: [facility];  Service: Gynecology;  Laterality: N/A;  . LAPAROSCOPY  09/07/2011   Procedure: LAPAROSCOPY DIAGNOSTIC;  Surgeon: Claiborne Billings A. Pamala Hurry, MD;  Location: [facility];  Service: Gynecology;  Laterality: N/A;  Diagnostic Laparoscopy with  Chromopertubation  . NO PAST SURGERIES       OB History   No obstetric history on file.     No family history on file.  Social History   Tobacco Use  . Smoking status: Never Smoker  . Smokeless tobacco: Never Used  Vaping Use  . Vaping Use: Never used  Substance Use Topics  . Alcohol use: Yes    Comment: socially  . Drug use: No    Home Medications Prior to Admission medications   Medication Sig Start Date End Date Taking? Authorizing Provider  acetaminophen (TYLENOL) 500 MG tablet Take 1,000 mg by mouth daily as needed. Takes 1 to 2 times/week for headache      [provider]  Aspirin-Acetaminophen-Caffeine (EXCEDRIN MIGRAINE PO) Take 2 tablets by mouth daily as needed. Takes 1 to 2 times week.     [provider]  levothyroxine (SYNTHROID, LEVOTHROID) 125 MCG tablet Take 125 mcg by mouth daily.      [provider]    Allergies    Codeine  Review of Systems   Review of Systems  Constitutional: Negative for chills and fever.  Eyes: Negative for photophobia and visual disturbance.  Respiratory: Positive for shortness of breath. Negative for cough.   Cardiovascular: Positive for chest pain and palpitations.  Gastrointestinal: Negative for abdominal pain and vomiting.  Genitourinary: Negative for dysuria and hematuria.  Musculoskeletal: Negative for arthralgias and myalgias.  Skin: Negative for color change and  rash.  Neurological: Positive for headaches. Negative for syncope.  Psychiatric/Behavioral: Negative for agitation and confusion.  All other systems reviewed and are negative.   Physical Exam Updated Vital Signs BP (!) 148/75   Pulse 67   Temp 98.2 F (36.8 C) (Oral)   Resp 19   Ht 5\' 3"  (1.6 m)   Wt 120.7 kg   SpO2 99%   BMI 47.12 kg/m   Physical Exam Vitals and nursing Craig reviewed.  Constitutional:      General: She is not in acute distress.    Appearance: She is well-developed. She is obese.  HENT:     Head:  Normocephalic and atraumatic.  Eyes:     Conjunctiva/sclera: Conjunctivae normal.  Cardiovascular:     Rate and Rhythm: Normal rate and regular rhythm.     Heart sounds: No murmur heard.   Pulmonary:     Effort: Pulmonary effort is normal. No respiratory distress.     Breath sounds: Normal breath sounds.  Abdominal:     Palpations: Abdomen is soft.     Tenderness: There is no abdominal tenderness.  Musculoskeletal:     Cervical back: Neck supple.     Right lower leg: No tenderness. No edema.     Left lower leg: No tenderness. No edema.  Skin:    General: Skin is warm and dry.  Neurological:     General: No focal deficit present.     Mental Status: She is alert and oriented to person, place, and time.     Cranial Nerves: No cranial nerve deficit.     Motor: No weakness.  Psychiatric:        Mood and Affect: Mood normal.        Behavior: Behavior normal.     ED Results / Procedures / Treatments   Labs (all labs ordered are listed, but only abnormal results are displayed) Labs Reviewed  BASIC METABOLIC PANEL - Abnormal; Notable for the following components:      Result Value   CO2 19 (*)    Glucose, Bld 123 (*)    Creatinine, Ser 1.03 (*)    All other components within normal limits  CBC  PREGNANCY, URINE  TROPONIN I (HIGH SENSITIVITY)    EKG EKG Interpretation  Date/Time:  Monday July 12 2020 14:23:55 EST Ventricular Rate:  90 PR Interval:  108 QRS Duration: 78 QT Interval:  388 QTC Calculation: 474 R Axis:   74 Text Interpretation: Sinus rhythm with short PR Otherwise normal ECG No old tracing to compare Confirmed by Aletta Edouard [phone]) on 07/12/2020 3:09:42 PM   Radiology DG Chest 2 View  Result Date: 07/12/2020 CLINICAL DATA:  Chest pain. EXAM: CHEST - 2 VIEW COMPARISON:  None. FINDINGS: The heart size and mediastinal contours are within normal limits. Both lungs are clear. No pneumothorax or pleural effusion is noted. The visualized skeletal  structures are unremarkable. IMPRESSION: No active cardiopulmonary disease. Electronically Signed   By: Marijo Conception M.D.   On: 07/12/2020 14:49    Procedures Procedures (including critical care time)  Medications Ordered in ED Medications - No data to display  ED Course  I have reviewed the triage vital signs and the nursing notes.  Pertinent labs & imaging results that were available during my care of the patient were reviewed by me and considered in my medical decision making (see chart for details).  This patient complains of intermittent chest pains, migraine, now resolved, for the past 2 days.  This involves an extensive number of treatment options, and is a complaint that carries with it a high risk of complications and morbidity.  The differential diagnosis includes gastritis (including reflux) vs muscular spasm vs fibromyalgia vs ACS vs PE vs PNA vs other  I ordered, reviewed, and interpreted labs, which included troponin of 2 (after > 12 hours of symptoms, doubt ACS with this level), preg negative, WBC 7.3, Hgb 13.3, BMP unremarkable. I ordered imaging studies which included dg chest  I independently visualized and interpreted imaging which showed no acute process  and the monitor tracing which showed NSR  ECG per my interpretation shows a benign sinus rhythm with no acute ischemic findings  After the interventions stated above, I reevaluated the patient and found she remained asymptomatic and was wanting to leave.  At this point my suspicion for ACS, PE, PNA, or other life-threatening cause of her symptoms is fairly low.  She is PERC negative.  I do not suspect a PE would cause transient chest pains in different aspects of her chest, particularly without persistent tachycardia or hypoxia.  Given that she is obese, which may slightly increase her risk for clot formation, we discussed DDimer testing (with possible CT PE follow up if positive) vs watchful waiting at home.  She  opted to monitor her symptoms at home and return if she develops worsening SOB, lightheadedness, chest pains.   I think this is a reasonable plan at this time.    Final Clinical Impression(s) / ED Diagnoses Final diagnoses:  Chest pain, unspecified type    Rx / DC Orders ED Discharge Orders    None       Lestat Golob, Carola Rhine, MD 07/13/20 [phone]

## 2020-07-12 NOTE — ED Notes (Signed)
In to perform hourly rounding, pt cont to be resting comfortably, no further c/o chest pain, up to restroom without assistance. Appears in no distress at this time

## 2020-07-12 NOTE — ED Notes (Signed)
Presents with chest pain, left ant side, "shooting pain" radiated to left axilla, when taking deep breaths pain would increase, pain is now intermittent, sharpe pain then became a dull pain, awoke with HA this am. Placed on cont cardiac monitoring with int NBP assessments and cont POX, currently denies any chest pain at this time, took 4 baby asa prior to coming to ED

## 2020-07-12 NOTE — Discharge Instructions (Addendum)
Your caregiver has diagnosed you as having chest pain that is not specific for one problem, but does not require admission.  You are at low risk for an acute heart condition or other serious illness. Chest pain comes from many different causes.   It is very important that you speak to your provider in 1-2 days for this visit.  Your doctor may want you to be seen in the office for a follow up visit.  SEEK IMMEDIATE MEDICAL ATTENTION IF: You have severe chest pain, especially if the pain is crushing or pressure-like and spreads to the arms, back, neck, or jaw, or if you have sweating, nausea (feeling sick to your stomach), or shortness of breath. THIS IS AN EMERGENCY. Don't wait to see if the pain will go away. Get medical help at once. Call 911 or 0 (operator). DO NOT drive yourself to the hospital.   Your chest pain gets worse and does not go away with rest.  You have an attack of chest pain lasting longer than usual, despite rest and treatment with the medications your caregiver has prescribed.  You wake from sleep with chest pain or shortness of breath.  You feel dizzy or faint.  You have chest pain not typical of your usual pain for which you originally saw your caregiver.

## 2020-07-12 NOTE — ED Notes (Signed)
Charge RN and ED MD aware that pt still has not been evaluated
